# Patient Record
Sex: Female | Born: 1970 | Race: Black or African American | Hispanic: No | Marital: Single
Health system: Southern US, Community
[De-identification: ages and names within clinical notes are randomized; demographics above are authoritative.]

## PROBLEM LIST (undated history)

## (undated) ENCOUNTER — Emergency Department (HOSPITAL_COMMUNITY): Admission: EM | Source: Home / Self Care

---

## 2020-07-04 ENCOUNTER — Encounter (HOSPITAL_COMMUNITY): Payer: Self-pay | Admitting: Physician Assistant

## 2020-07-04 ENCOUNTER — Emergency Department (HOSPITAL_COMMUNITY)
Admission: EM | Admit: 2020-07-04 | Discharge: 2020-07-04 | Disposition: A | Discharge status: Home / Self Care | Payer: 59 | Attending: Emergency Medicine | Admitting: Emergency Medicine

## 2020-07-04 ENCOUNTER — Emergency Department (HOSPITAL_COMMUNITY): Payer: 59

## 2020-07-04 ENCOUNTER — Other Ambulatory Visit: Payer: Self-pay

## 2020-07-04 DIAGNOSIS — R059 Cough, unspecified: Secondary | ICD-10-CM | POA: Insufficient documentation

## 2020-07-04 DIAGNOSIS — Z7722 Contact with and (suspected) exposure to environmental tobacco smoke (acute) (chronic): Secondary | ICD-10-CM | POA: Diagnosis not present

## 2020-07-04 MED ORDER — MONTELUKAST SODIUM 10 MG PO TABS: 10.0000 mg | ORAL_TABLET | Freq: Every day | ORAL | 0 refills | Status: AC

## 2020-07-04 MED ORDER — ALBUTEROL SULFATE HFA 108 (90 BASE) MCG/ACT IN AERS
2.0000 | INHALATION_SPRAY | Freq: Once | RESPIRATORY_TRACT | Status: AC
  Administered 2020-07-04: 2 via RESPIRATORY_TRACT
  Filled 2020-07-04: qty 6.7

## 2020-07-04 NOTE — ED Triage Notes (Signed)
Patient reports cough x2 months. Says she thinks it started when her son began smoking cigarettes in the home. Patient says cough has gotten deeper over the past week and is pushing on bladder.

## 2020-07-04 NOTE — ED Provider Notes (Signed)
[facility]-EMERGENCY DEPT Provider Note   CSN: [id number] Arrival date & time: 07/04/20  1301     History Chief Complaint  Patient presents with  . Cough    Kim Vargas is a 50 y.o. female.  HPI   50 year old female presents the emergency department today for evaluation of a cough that has been present for the last several months.  She denies any chest pain, shortness of breath, hemoptysis or any productive cough.  She denies any other associated upper respiratory symptoms. Patient does note that she has a history of allergies for which she takes allergy medication for daily.  She notices that her cough is worse when she is around her son who smokes cigarettes.  Denies leg pain/swelling, hemoptysis, recent surgery/trauma, recent long travel, hormone use, personal hx of cancer, or hx of DVT/PE.    History reviewed. No pertinent past medical history.  There are no problems to display for this patient.   History reviewed. No pertinent surgical history.   OB History   No obstetric history on file.     History reviewed. No pertinent family history.  Social History   Tobacco Use  . Smoking status: Passive Smoke Exposure - Never Smoker  . Smokeless tobacco: Never Used    Home Medications Prior to Admission medications   Medication Sig Start Date End Date Taking? Authorizing Provider  montelukast (SINGULAIR) 10 MG tablet Take 1 tablet (10 mg total) by mouth at bedtime. 07/04/20 08/03/20 Yes Jonanthony Nahar S, PA-C    Allergies    Patient has no known allergies.  Review of Systems   Review of Systems  Constitutional: Negative for fever.  HENT: Negative for congestion, postnasal drip, rhinorrhea and sore throat.   Eyes: Negative for visual disturbance.  Respiratory: Positive for cough. Negative for shortness of breath.   Cardiovascular: Negative for chest pain and leg swelling.  Gastrointestinal: Negative for abdominal pain.  Genitourinary:  Negative for pelvic pain.  Musculoskeletal: Negative for back pain.    Physical Exam Updated Vital Signs BP 133/81 (BP Location: Left Arm)   Pulse 72   Temp 98.1 F (36.7 C) (Oral)   Resp 18   Ht 5' (1.524 m)   LMP 06/20/2020   SpO2 97%   Physical Exam Vitals and nursing note reviewed.  Constitutional:      General: She is not in acute distress.    Appearance: She is well-developed.  HENT:     Head: Normocephalic and atraumatic.  Eyes:     Conjunctiva/sclera: Conjunctivae normal.  Cardiovascular:     Rate and Rhythm: Normal rate and regular rhythm.     Heart sounds: Normal heart sounds. No murmur heard.   Pulmonary:     Effort: Pulmonary effort is normal. No respiratory distress.     Breath sounds: Normal breath sounds. No wheezing, rhonchi or rales.  Abdominal:     Palpations: Abdomen is soft.     Tenderness: There is no abdominal tenderness.  Musculoskeletal:        General: No tenderness.     Cervical back: Neck supple.     Right lower leg: No edema.     Left lower leg: No edema.  Skin:    General: Skin is warm and dry.  Neurological:     Mental Status: She is alert.     ED Results / Procedures / Treatments   Labs (all labs ordered are listed, but only abnormal results are displayed) Labs Reviewed - No data  to display  EKG None  Radiology DG Chest 2 View  Result Date: 07/04/2020 CLINICAL DATA:  Nonproductive cough for 2 months. EXAM: CHEST - 2 VIEW COMPARISON:  01/16/2018. FINDINGS: The heart size and mediastinal contours are within normal limits. Both lungs are clear. No pleural effusion or pneumothorax. The visualized skeletal structures are unremarkable. IMPRESSION: No active cardiopulmonary disease. Electronically Signed   By: Amie Portland M.D.   On: 07/04/2020 15:09    Procedures Procedures   Medications Ordered in ED Medications  albuterol (VENTOLIN HFA) 108 (90 Base) MCG/ACT inhaler 2 puff (2 puffs Inhalation Given 07/04/20 1709)    ED  Course  I have reviewed the triage vital signs and the nursing notes.  Pertinent labs & imaging results that were available during my care of the patient were reviewed by me and considered in my medical decision making (see chart for details).    MDM Rules/Calculators/A&P                          50 year old female presenting the emergency department today for evaluation of a cough that has been ongoing for several months.  Cough is worse when exposed to cigarettes.  She does have a history of allergies for which she takes Benadryl for.  Lungs are clear to auscultation bilaterally, heart with regular rate and rhythm.  She has not had any fevers or other associated respiratory symptoms and she does not have any risk factors for PE, I have very low suspicion for this.  Her chest x-ray is clear and does not show any abnormalities.  I suspect that her symptoms may be allergic in nature and may be triggered by her son cigarette smoking.  We will give her an inhaler and will also give her Singulair.  Advised that she avoid secondhand smoke.  Further advised that she follow-up with PCP in regards to her visit to the ED today and return to the ED for any new or worsening symptoms.  She voiced understanding the plan and reasons to return. all Questions answered.  Patient stable for discharge.  Final Clinical Impression(s) / ED Diagnoses Final diagnoses:  Cough    Rx / DC Orders ED Discharge Orders         Ordered    montelukast (SINGULAIR) 10 MG tablet  Daily at bedtime        07/04/20 1649           Jerritt Cardoza S, PA-C 07/04/20 1815    Linwood Dibbles, MD 07/04/20 2153

## 2020-07-04 NOTE — Discharge Instructions (Signed)
Take 2 puffs of the albuterol inhaler as needed for cough.  Use Singulair as directed  Please follow up with your primary care provider within 5-7 days for re-evaluation of your symptoms. If you do not have a primary care provider, information for a healthcare clinic has been provided for you to make arrangements for follow up care. Please return to the emergency department for any new or worsening symptoms.

## 2020-07-04 NOTE — ED Provider Notes (Signed)
Emergency Medicine Provider Triage Evaluation Note  Kim Vargas , a 50 y.o. female  was evaluated in triage.  Pt complains of cough that started a cough months ago. Denies chest pain, sob, fever. Neg covid test at home  Review of Systems  Positive: cough Negative: Chest pain, sob, fevers  Physical Exam  BP (!) 145/89 (BP Location: Left Arm)   Pulse 76   Temp 98.1 F (36.7 C) (Oral)   Resp 18   Ht 5' (1.524 m)   LMP 06/20/2020   SpO2 100%  Gen:   Awake, no distress   Resp:  Normal effort  MSK:   Moves extremities without difficulty Other:  Lungs ctab  Medical Decision Making  Medically screening exam initiated at 2:01 PM.  Appropriate orders placed.  Kim Vargas was informed that the remainder of the evaluation will be completed by another provider, this initial triage assessment does not replace that evaluation, and the importance of remaining in the ED until their evaluation is complete.     Karrie Meres, PA-C 07/04/20 1401    Pricilla Loveless, MD 07/04/20 1501

## 2022-04-08 IMAGING — CR DG CHEST 2V
2 series · 2 of 2 positions shown · non-contrast
Comparison: 01/16/2018.

CLINICAL DATA: Nonproductive cough for 2 months.

EXAM:
CHEST - 2 VIEW

[w chest pa]
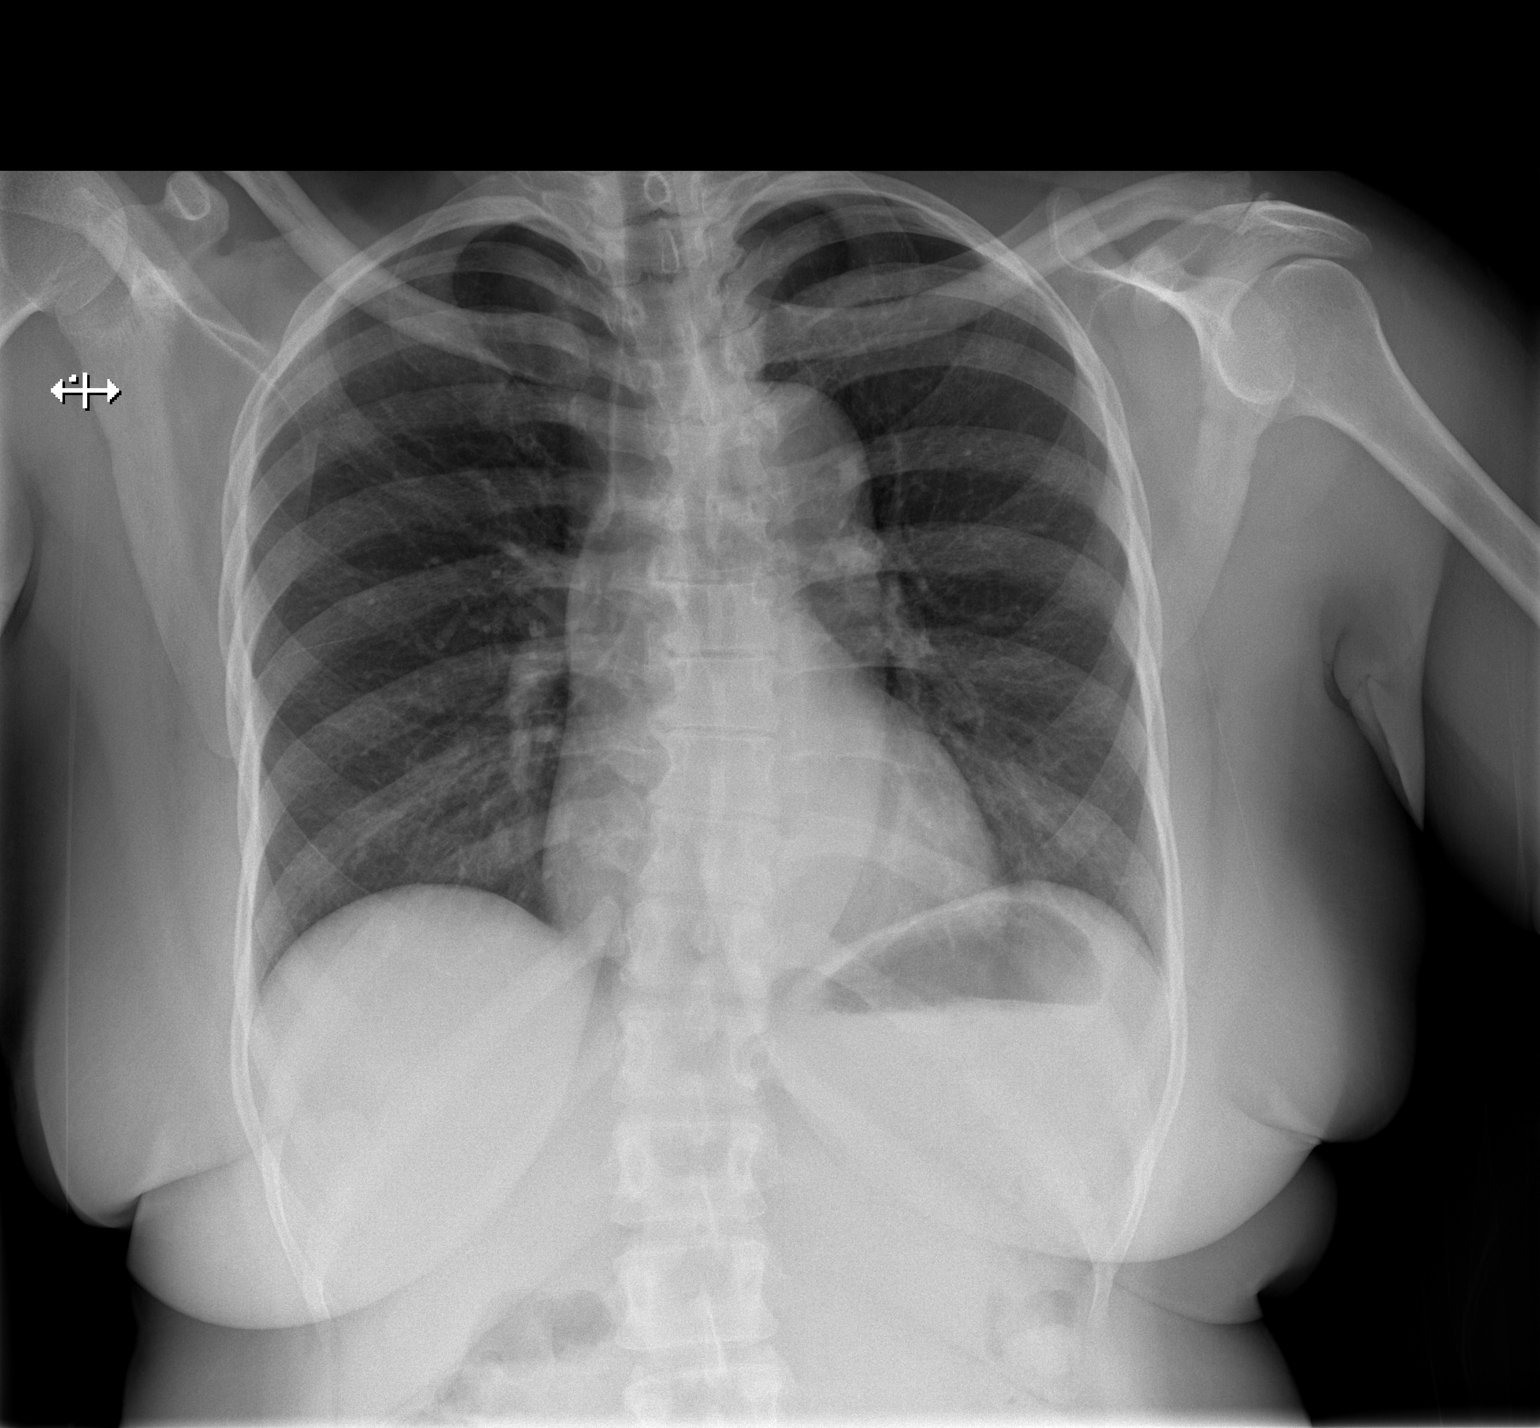

[w chest lat]
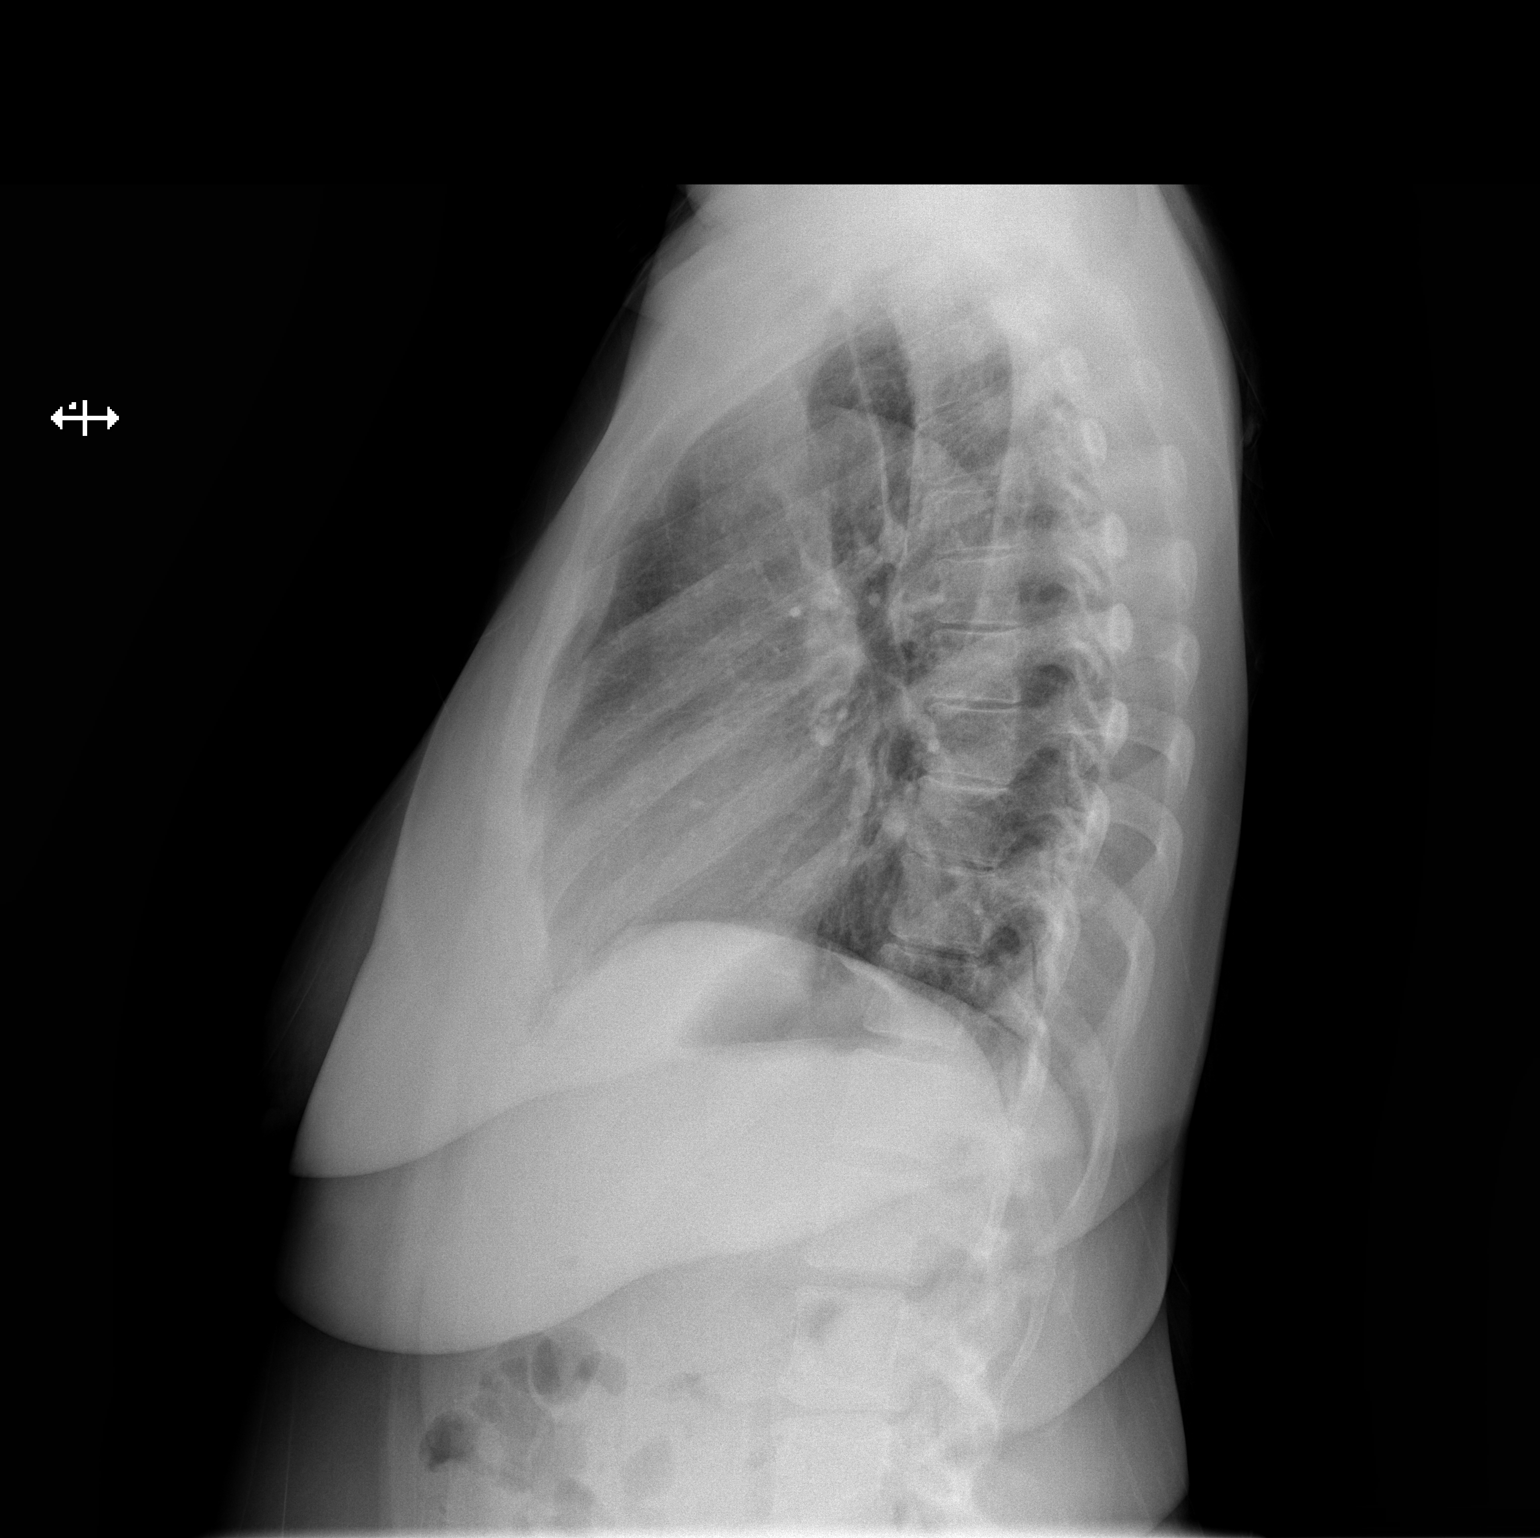

[2 of 2 positions shown; findings below may reference images not displayed]

FINDINGS: The heart size and mediastinal contours are within normal limits.
Both lungs are clear. No pleural effusion or pneumothorax. The
visualized skeletal structures are unremarkable.
IMPRESSION: No active cardiopulmonary disease.
